# Patient Record
Sex: Male | Born: 2005 | Race: White | Hispanic: No | Marital: Single | Smoking: Never smoker
Health system: Southern US, Community
[De-identification: ages and names within clinical notes are randomized; demographics above are authoritative.]

## PROBLEM LIST (undated history)

## (undated) DIAGNOSIS — R011 Cardiac murmur, unspecified: Secondary | ICD-10-CM

## (undated) HISTORY — PX: PLANTAR'S WART EXCISION: SHX2240

## (undated) HISTORY — PX: TOE SURGERY: SHX1073

## (undated) HISTORY — DX: Cardiac murmur, unspecified: R01.1

---
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
## 2023-01-06 DIAGNOSIS — R030 Elevated blood-pressure reading, without diagnosis of hypertension: Secondary | ICD-10-CM | POA: Diagnosis not present

## 2023-01-06 DIAGNOSIS — H6093 Unspecified otitis externa, bilateral: Secondary | ICD-10-CM | POA: Diagnosis not present

## 2023-01-06 DIAGNOSIS — H6691 Otitis media, unspecified, right ear: Secondary | ICD-10-CM | POA: Diagnosis not present

## 2023-04-16 ENCOUNTER — Ambulatory Visit: Payer: BC Managed Care – PPO | Admitting: Nurse Practitioner

## 2023-04-16 VITALS — BP 106/56 | HR 72 | Temp 97.9°F | Ht 69.0 in | Wt 251.0 lb

## 2023-04-16 DIAGNOSIS — B369 Superficial mycosis, unspecified: Secondary | ICD-10-CM | POA: Diagnosis not present

## 2023-04-16 DIAGNOSIS — Z23 Encounter for immunization: Secondary | ICD-10-CM | POA: Diagnosis not present

## 2023-04-16 DIAGNOSIS — M542 Cervicalgia: Secondary | ICD-10-CM | POA: Diagnosis not present

## 2023-04-16 MED ORDER — KETOCONAZOLE 2 % EX CREA: 1.0000 | TOPICAL_CREAM | Freq: Two times a day (BID) | CUTANEOUS | 0 refills | Status: AC

## 2023-04-16 NOTE — Progress Notes (Unsigned)
Subjective:    Patient ID: Jorge Wiley, male    DOB: [birth year], 17 y.o.   MRN: [id number]  HPI Pain in tailbone with sitting for 1.5 weeks with clear drainage, localized to the upper portion of the gluteal fold. Reports mother removed hairs from his gluteal fold two days ago and the pain has resolved. Was taking Advil for the pain, which alleviated it. Denies any fevers or chills. No other lesions, cysts, or drainage. Denies any prior history of this.   Reports a wrestling injury in which he sustained a blow to the back of his neck in August. He is having some internal pain in this area. Denies any weakness or tingling in his extremities, headaches, dizziness, vision changes, or LOC. States he believes he had a concussion with this incident.   Review of Systems  Constitutional:  Negative for chills and fever.  Eyes:  Negative for visual disturbance.  Respiratory:  Negative for cough, chest tightness and shortness of breath.   Cardiovascular:  Negative for chest pain.  Musculoskeletal:  Positive for neck pain.  Skin:  Positive for rash.       "Cyst on tailbone with drainage"  Neurological:  Negative for dizziness, weakness, numbness and headaches.       Objective:   Physical Exam Constitutional:      General: He is not in acute distress.    Appearance: Normal appearance. He is not ill-appearing.  Cardiovascular:     Rate and Rhythm: Normal rate and regular rhythm.     Heart sounds: Normal heart sounds. No murmur heard. Pulmonary:     Breath sounds: Normal breath sounds. No wheezing.  Musculoskeletal:     Cervical back: Full passive range of motion without pain and normal range of motion. No edema, rigidity or crepitus. No pain with movement, spinous process tenderness or muscular tenderness.  Skin:    General: Skin is warm and moist.     Comments: Tiny punctate lesions in a linear form on the upper gluteal fold. Erythematous, moist surrounding skin with clear discharge  present. No cystic lesions noted.  Neurological:     Mental Status: He is alert.     Motor: No weakness.     Deep Tendon Reflexes: Reflexes normal.  Psychiatric:        Mood and Affect: Mood normal.        Behavior: Behavior normal.        Thought Content: Thought content normal.        Judgment: Judgment normal.    Vitals:   04/16/23 1144  BP: (!) 106/56  Pulse: 72  Temp: 97.9 F (36.6 C)  SpO2: 99%          Assessment & Plan:   Problem List Items Addressed This Visit   None Visit Diagnoses     Fungal infection of skin    -  Primary   Relevant Medications   ketoconazole (NIZORAL) 2 % cream   Immunization due       Relevant Orders   Flu vaccine trivalent PF, 6mos and older(Flulaval,Afluria,Fluarix,Fluzone) (Completed)   Neck pain       Relevant Orders   DG Cervical Spine Complete      Recommended mixing ketoconazole ointment with OTC Desitin or barrier cream and applying it to the affected area BID. Keep area clean and dry.   Meds ordered this encounter  Medications   ketoconazole (NIZORAL) 2 % cream    Sig: Apply 1 Application topically 2 (  two) times daily. Prn rash    Dispense:  30 g    Refill:  0    Order Specific Question:   Supervising Provider    Answer:   Lilyan Punt A [[id number]]  Flu vaccine today. Cervical spine xray pending. Recommend ice/heat and stretching exercises.  No evidence of post concussion syndrome.  Return if symptoms worsen or fail to improve.  I have seen and examined this patient alongside the NP student. I have reviewed and verified the student note and agree with the assessment and plan.  Sherie Don, FNP

## 2023-04-19 ENCOUNTER — Encounter: Payer: Self-pay | Admitting: Nurse Practitioner

## 2023-04-23 ENCOUNTER — Other Ambulatory Visit: Payer: Self-pay | Admitting: Nurse Practitioner

## 2023-04-23 MED ORDER — DOXYCYCLINE HYCLATE 100 MG PO CAPS: 100.0000 mg | ORAL_CAPSULE | Freq: Two times a day (BID) | ORAL | 0 refills | Status: AC

## 2023-04-23 NOTE — Progress Notes (Signed)
See my chart message from today from [place] his mother.

## 2023-07-29 DIAGNOSIS — J209 Acute bronchitis, unspecified: Secondary | ICD-10-CM | POA: Diagnosis not present

## 2023-07-29 DIAGNOSIS — R051 Acute cough: Secondary | ICD-10-CM | POA: Diagnosis not present

## 2023-08-25 DIAGNOSIS — R030 Elevated blood-pressure reading, without diagnosis of hypertension: Secondary | ICD-10-CM | POA: Diagnosis not present

## 2023-08-25 DIAGNOSIS — L0291 Cutaneous abscess, unspecified: Secondary | ICD-10-CM | POA: Diagnosis not present
